# Patient Record
Sex: Female | Born: 1955 | Race: White | Hispanic: No | Marital: Single
Health system: Southern US, Community
[De-identification: ages and names within clinical notes are randomized; demographics above are authoritative.]

---

## 2013-05-15 ENCOUNTER — Emergency Department (HOSPITAL_COMMUNITY)
Admission: EM | Admit: 2013-05-15 | Discharge: 2013-05-16 | Disposition: A | Discharge status: Home / Self Care | Payer: BC Managed Care – PPO | Attending: Emergency Medicine | Admitting: Emergency Medicine

## 2013-05-15 ENCOUNTER — Encounter (HOSPITAL_COMMUNITY): Payer: Self-pay | Admitting: Emergency Medicine

## 2013-05-15 DIAGNOSIS — K8050 Calculus of bile duct without cholangitis or cholecystitis without obstruction: Secondary | ICD-10-CM

## 2013-05-15 DIAGNOSIS — K8020 Calculus of gallbladder without cholecystitis without obstruction: Secondary | ICD-10-CM | POA: Insufficient documentation

## 2013-05-15 NOTE — ED Notes (Signed)
Pt states that she is from out of town and has had abdominal pain, N/V since 7pm tonight. States she has had gas, and bloating before but never anything this bad. Denies any medical hx or surgeries.

## 2013-05-16 ENCOUNTER — Emergency Department (HOSPITAL_COMMUNITY): Payer: BC Managed Care – PPO

## 2013-05-16 LAB — COMPREHENSIVE METABOLIC PANEL
ALK PHOS: 65 U/L (ref 39–117)
ALT: 23 U/L (ref 0–35)
AST: 19 U/L (ref 0–37)
Albumin: 4 g/dL (ref 3.5–5.2)
BILIRUBIN TOTAL: 0.2 mg/dL — AB (ref 0.3–1.2)
BUN: 13 mg/dL (ref 6–23)
CHLORIDE: 99 meq/L (ref 96–112)
CO2: 26 meq/L (ref 19–32)
Calcium: 9.3 mg/dL (ref 8.4–10.5)
Creatinine, Ser: 0.74 mg/dL (ref 0.50–1.10)
Glucose, Bld: 112 mg/dL — ABNORMAL HIGH (ref 70–99)
Potassium: 4 mEq/L (ref 3.7–5.3)
SODIUM: 138 meq/L (ref 137–147)
Total Protein: 7.3 g/dL (ref 6.0–8.3)

## 2013-05-16 LAB — LIPASE, BLOOD: Lipase: 40 U/L (ref 11–59)

## 2013-05-16 LAB — I-STAT TROPONIN, ED: Troponin i, poc: 0 ng/mL (ref 0.00–0.08)

## 2013-05-16 LAB — CBC WITH DIFFERENTIAL/PLATELET
BASOS ABS: 0.1 10*3/uL (ref 0.0–0.1)
Basophils Relative: 0 % (ref 0–1)
Eosinophils Absolute: 0.2 10*3/uL (ref 0.0–0.7)
Eosinophils Relative: 2 % (ref 0–5)
HCT: 40.9 % (ref 36.0–46.0)
Hemoglobin: 13.5 g/dL (ref 12.0–15.0)
LYMPHS PCT: 15 % (ref 12–46)
Lymphs Abs: 1.7 10*3/uL (ref 0.7–4.0)
MCH: 29.4 pg (ref 26.0–34.0)
MCHC: 33 g/dL (ref 30.0–36.0)
MCV: 89.1 fL (ref 78.0–100.0)
Monocytes Absolute: 0.7 10*3/uL (ref 0.1–1.0)
Monocytes Relative: 6 % (ref 3–12)
Neutro Abs: 8.8 10*3/uL — ABNORMAL HIGH (ref 1.7–7.7)
Neutrophils Relative %: 77 % (ref 43–77)
Platelets: 281 10*3/uL (ref 150–400)
RBC: 4.59 MIL/uL (ref 3.87–5.11)
RDW: 13.3 % (ref 11.5–15.5)
WBC: 11.4 10*3/uL — AB (ref 4.0–10.5)

## 2013-05-16 LAB — URINALYSIS, ROUTINE W REFLEX MICROSCOPIC
BILIRUBIN URINE: NEGATIVE
GLUCOSE, UA: NEGATIVE mg/dL
HGB URINE DIPSTICK: NEGATIVE
KETONES UR: NEGATIVE mg/dL
Nitrite: NEGATIVE
PH: 7 (ref 5.0–8.0)
Protein, ur: NEGATIVE mg/dL
SPECIFIC GRAVITY, URINE: 1.022 (ref 1.005–1.030)
Urobilinogen, UA: 0.2 mg/dL (ref 0.0–1.0)

## 2013-05-16 LAB — URINE MICROSCOPIC-ADD ON

## 2013-05-16 MED ORDER — PANTOPRAZOLE SODIUM 40 MG IV SOLR
40.0000 mg | Freq: Once | INTRAVENOUS | Status: AC
  Administered 2013-05-16: 40 mg via INTRAVENOUS
  Filled 2013-05-16: qty 40

## 2013-05-16 MED ORDER — HYDROCODONE-ACETAMINOPHEN 5-325 MG PO TABS: 1.0000 | ORAL_TABLET | ORAL | Status: AC | PRN

## 2013-05-16 MED ORDER — ONDANSETRON HCL 4 MG PO TABS: 4.0000 mg | ORAL_TABLET | Freq: Four times a day (QID) | ORAL | Status: AC | PRN

## 2013-05-16 MED ORDER — HYDROMORPHONE HCL PF 1 MG/ML IJ SOLN
1.0000 mg | Freq: Once | INTRAMUSCULAR | Status: AC
  Administered 2013-05-16: 1 mg via INTRAVENOUS
  Filled 2013-05-16: qty 1

## 2013-05-16 MED ORDER — ONDANSETRON HCL 4 MG/2ML IJ SOLN
4.0000 mg | Freq: Once | INTRAMUSCULAR | Status: AC
  Administered 2013-05-16: 4 mg via INTRAVENOUS
  Filled 2013-05-16: qty 2

## 2013-05-16 MED ORDER — SODIUM CHLORIDE 0.9 % IV SOLN
1000.0000 mL | INTRAVENOUS | Status: DC
  Administered 2013-05-16: 1000 mL via INTRAVENOUS

## 2013-05-16 MED ORDER — SODIUM CHLORIDE 0.9 % IV SOLN
1000.0000 mL | Freq: Once | INTRAVENOUS | Status: AC
  Administered 2013-05-16: 1000 mL via INTRAVENOUS

## 2013-05-16 NOTE — Discharge Instructions (Signed)
Stay on a low fat diet until you have your gallbladder removed.  Biliary Colic  Biliary colic is a steady or irregular pain in the upper abdomen. It is usually under the right side of the rib cage. It happens when gallstones interfere with the normal flow of bile from the gallbladder. Bile is a liquid that helps to digest fats. Bile is made in the liver and stored in the gallbladder. When you eat a meal, bile passes from the gallbladder through the cystic duct and the common bile duct into the small intestine. There, it mixes with partially digested food. If a gallstone blocks either of these ducts, the normal flow of bile is blocked. The muscle cells in the bile duct contract forcefully to try to move the stone. This causes the pain of biliary colic.  SYMPTOMS   A person with biliary colic usually complains of pain in the upper abdomen. This pain can be:  In the center of the upper abdomen just below the breastbone.  In the upper-right part of the abdomen, near the gallbladder and liver.  Spread back toward the right shoulder blade.  Nausea and vomiting.  The pain usually occurs after eating.  Biliary colic is usually triggered by the digestive system's demand for bile. The demand for bile is high after fatty meals. Symptoms can also occur when a person who has been fasting suddenly eats a very large meal. Most episodes of biliary colic pass after 1 to 5 hours. After the most intense pain passes, your abdomen may continue to ache mildly for about 24 hours. DIAGNOSIS  After you describe your symptoms, your caregiver will perform a physical exam. He or she will pay attention to the upper right portion of your belly (abdomen). This is the area of your liver and gallbladder. An ultrasound will help your caregiver look for gallstones. Specialized scans of the gallbladder may also be done. Blood tests may be done, especially if you have fever or if your pain persists. PREVENTION  Biliary colic can  be prevented by controlling the risk factors for gallstones. Some of these risk factors, such as heredity, increasing age, and pregnancy are a normal part of life. Obesity and a high-fat diet are risk factors you can change through a healthy lifestyle. Women going through menopause who take hormone replacement therapy (estrogen) are also more likely to develop biliary colic. TREATMENT   Pain medication may be prescribed.  You may be encouraged to eat a fat-free diet.  If the first episode of biliary colic is severe, or episodes of colic keep retuning, surgery to remove the gallbladder (cholecystectomy) is usually recommended. This procedure can be done through small incisions using an instrument called a laparoscope. The procedure often requires a brief stay in the hospital. Some people can leave the hospital the same day. It is the most widely used treatment in people troubled by painful gallstones. It is effective and safe, with no complications in more than 90% of cases.  If surgery cannot be done, medication that dissolves gallstones may be used. This medication is expensive and can take months or years to work. Only small stones will dissolve.  Rarely, medication to dissolve gallstones is combined with a procedure called shock-wave lithotripsy. This procedure uses carefully aimed shock waves to break up gallstones. In many people treated with this procedure, gallstones form again within a few years. PROGNOSIS  If gallstones block your cystic duct or common bile duct, you are at risk for repeated episodes  of biliary colic. There is also a 25% chance that you will develop a gallbladder infection(acute cholecystitis), or some other complication of gallstones within 10 to 20 years. If you have surgery, schedule it at a time that is convenient for you and at a time when you are not sick. HOME CARE INSTRUCTIONS   Drink plenty of clear fluids.  Avoid fatty, greasy or fried foods, or any foods that  make your pain worse.  Take medications as directed. SEEK MEDICAL CARE IF:   You develop a fever over 100.5 F (38.1 C).  Your pain gets worse over time.  You develop nausea that prevents you from eating and drinking.  You develop vomiting. SEEK IMMEDIATE MEDICAL CARE IF:   You have continuous or severe belly (abdominal) pain which is not relieved with medications.  You develop nausea and vomiting which is not relieved with medications.  You have symptoms of biliary colic and you suddenly develop a fever and shaking chills. This may signal cholecystitis. Call your caregiver immediately.  You develop a yellow color to your skin or the white part of your eyes (jaundice). Document Released: 07/19/2005 Document Revised: 05/10/2011 Document Reviewed: 09/28/2007 [facility] Patient Information 2014 [place], [place].  Acetaminophen; Hydrocodone tablets or capsules What is this medicine? ACETAMINOPHEN; HYDROCODONE (a set a MEE noe fen; hye droe KOE done) is a pain reliever. It is used to treat mild to moderate pain. This medicine may be used for other purposes; ask your health care provider or pharmacist if you have questions. COMMON BRAND NAME(S): Anexsia, Bancap HC , Ceta-Plus, Co-Gesic, Comfortpak , Dolagesic, [employer], [address] , [address] , Hydrogesic, Lorcet HD, Lorcet Plus, Lorcet, [place], Margesic H, Maxidone, [place], Polygesic, [place], [place], Vicodin ES, Vicodin HP, Vicodin, [place] What should I tell my health care provider before I take this medicine? They need to know if you have any of these conditions: -brain tumor -Crohn's disease, inflammatory bowel disease, or ulcerative colitis -drug abuse or addiction -head injury -heart or circulation problems -if you often drink alcohol -kidney disease or problems going to the bathroom -liver disease -lung disease, asthma, or breathing problems -an unusual or allergic reaction to acetaminophen, hydrocodone, other opioid  analgesics, other medicines, foods, dyes, or preservatives -pregnant or trying to get pregnant -breast-feeding How should I use this medicine? Take this medicine by mouth. Swallow it with a full glass of water. Follow the directions on the prescription label. If the medicine upsets your stomach, take the medicine with food or milk. Do not take more than you are told to take. Talk to your pediatrician regarding the use of this medicine in children. This medicine is not approved for use in children. Overdosage: If you think you have taken too much of this medicine contact a poison control center or emergency room at once. NOTE: This medicine is only for you. Do not share this medicine with others. What if I miss a dose? If you miss a dose, take it as soon as you can. If it is almost time for your next dose, take only that dose. Do not take double or extra doses. What may interact with this medicine? -alcohol -antihistamines -isoniazid -medicines for depression, anxiety, or psychotic disturbances -medicines for sleep -muscle relaxants -naltrexone -narcotic medicines (opiates) for pain -phenobarbital -ritonavir -tramadol This list may not describe all possible interactions. Give your health care provider a list of all the medicines, herbs, non-prescription drugs, or dietary supplements you use. Also tell them if you smoke, drink alcohol,  or use illegal drugs. Some items may interact with your medicine. What should I watch for while using this medicine? Tell your doctor or health care professional if your pain does not go away, if it gets worse, or if you have new or a different type of pain. You may develop tolerance to the medicine. Tolerance means that you will need a higher dose of the medicine for pain relief. Tolerance is normal and is expected if you take the medicine for a long time. Do not suddenly stop taking your medicine because you may develop a severe reaction. Your body becomes  used to the medicine. This does NOT mean you are addicted. Addiction is a behavior related to getting and using a drug for a non-medical reason. If you have pain, you have a medical reason to take pain medicine. Your doctor will tell you how much medicine to take. If your doctor wants you to stop the medicine, the dose will be slowly lowered over time to avoid any side effects. You may get drowsy or dizzy when you first start taking the medicine or change doses. Do not drive, use machinery, or do anything that may be dangerous until you know how the medicine affects you. Stand or sit up slowly. There are different types of narcotic medicines (opiates) for pain. If you take more than one type at the same time, you may have more side effects. Give your health care provider a list of all medicines you use. Your doctor will tell you how much medicine to take. Do not take more medicine than directed. Call emergency for help if you have problems breathing. The medicine will cause constipation. Try to have a bowel movement at least every 2 to 3 days. If you do not have a bowel movement for 3 days, call your doctor or health care professional. Too much acetaminophen can be very dangerous. Do not take Tylenol (acetaminophen) or medicines that contain acetaminophen with this medicine. Many non-prescription medicines contain acetaminophen. Always read the labels carefully. What side effects may I notice from receiving this medicine? Side effects that you should report to your doctor or health care professional as soon as possible: -allergic reactions like skin rash, itching or hives, swelling of the face, lips, or tongue -breathing problems -confusion -feeling faint or lightheaded, falls -stomach pain -yellowing of the eyes or skin Side effects that usually do not require medical attention (report to your doctor or health care professional if they continue or are bothersome): -nausea, vomiting -stomach  upset This list may not describe all possible side effects. Call your doctor for medical advice about side effects. You may report side effects to FDA at 1-800-FDA-1088. Where should I keep my medicine? Keep out of the reach of children. This medicine can be abused. Keep your medicine in a safe place to protect it from theft. Do not share this medicine with anyone. Selling or giving away this medicine is dangerous and against the law. Store at room temperature between 15 and 30 degrees C (59 and 86 degrees F). Protect from light. Keep container tightly closed.  Throw away any unused medicine after the expiration date. Discard unused medicine and used packaging carefully. Pets and children can be harmed if they find used or lost packages. NOTE: This sheet is a summary. It may not cover all possible information. If you have questions about this medicine, talk to your doctor, pharmacist, or health care provider.  2014, Elsevier/Gold Standard. (2012-10-09 13:15:56)  Ondansetron tablets What  is this medicine? ONDANSETRON (on DAN se tron) is used to treat nausea and vomiting caused by chemotherapy. It is also used to prevent or treat nausea and vomiting after surgery. This medicine may be used for other purposes; ask your health care provider or pharmacist if you have questions. COMMON BRAND NAME(S): Zofran What should I tell my health care provider before I take this medicine? They need to know if you have any of these conditions: -heart disease -history of irregular heartbeat -liver disease -low levels of magnesium or potassium in the blood -an unusual or allergic reaction to ondansetron, granisetron, other medicines, foods, dyes, or preservatives -pregnant or trying to get pregnant -breast-feeding How should I use this medicine? Take this medicine by mouth with a glass of water. Follow the directions on your prescription label. Take your doses at regular intervals. Do not take your medicine  more often than directed. Talk to your pediatrician regarding the use of this medicine in children. Special care may be needed. Overdosage: If you think you have taken too much of this medicine contact a poison control center or emergency room at once. NOTE: This medicine is only for you. Do not share this medicine with others. What if I miss a dose? If you miss a dose, take it as soon as you can. If it is almost time for your next dose, take only that dose. Do not take double or extra doses. What may interact with this medicine? Do not take this medicine with any of the following medications: -apomorphine -certain medicines for fungal infections like fluconazole, itraconazole, ketoconazole, posaconazole, voriconazole -cisapride -dofetilide -dronedarone -pimozide -thioridazine -ziprasidone  This medicine may also interact with the following medications: -carbamazepine -certain medicines for depression, anxiety, or psychotic disturbances -fentanyl -linezolid -MAOIs like Carbex, Eldepryl, Marplan, Nardil, and Parnate -methylene blue (injected into a vein) -other medicines that prolong the QT interval (cause an abnormal heart rhythm) -phenytoin -rifampicin -tramadol This list may not describe all possible interactions. Give your health care provider a list of all the medicines, herbs, non-prescription drugs, or dietary supplements you use. Also tell them if you smoke, drink alcohol, or use illegal drugs. Some items may interact with your medicine. What should I watch for while using this medicine? Check with your doctor or health care professional right away if you have any sign of an allergic reaction. What side effects may I notice from receiving this medicine? Side effects that you should report to your doctor or health care professional as soon as possible: -allergic reactions like skin rash, itching or hives, swelling of the face, lips or tongue -breathing  problems -confusion -dizziness -fast or irregular heartbeat -feeling faint or lightheaded, falls -fever and chills -loss of balance or coordination -seizures -sweating -swelling of the hands or feet -tightness in the chest -tremors -unusually weak or tired Side effects that usually do not require medical attention (report to your doctor or health care professional if they continue or are bothersome): -constipation or diarrhea -headache This list may not describe all possible side effects. Call your doctor for medical advice about side effects. You may report side effects to FDA at 1-800-FDA-1088. Where should I keep my medicine? Keep out of the reach of children. Store between 2 and 30 degrees C (36 and 86 degrees F). Throw away any unused medicine after the expiration date. NOTE: This sheet is a summary. It may not cover all possible information. If you have questions about this medicine, talk to your doctor, pharmacist, or  health care provider.  2014, Elsevier/Gold Standard. (2012-11-22 16:27:45)

## 2013-05-16 NOTE — ED Provider Notes (Signed)
CSN: [id number]     Arrival date & time 05/15/13  2330 History   First MD Initiated Contact with Patient 05/16/13 0139     Chief Complaint  Patient presents with  . Abdominal Pain  . Emesis     (Consider location/radiation/quality/duration/timing/severity/associated sxs/prior Treatment) Patient is a 58 y.o. female presenting with abdominal pain and vomiting. The history is provided by the patient.  Abdominal Pain Associated symptoms: vomiting   Emesis Associated symptoms: abdominal pain   She is about 7 PM severe epigastric pain with radiation to the back into the lower chest. Pain started out as a pressure and like a lot of gas. It has gotten much more severe. She is vomited several times with no relief of pain. Since arrival in the ED, she got a dose of ondansetron and states she no longer feels nauseated he will have paroxysms of vomiting in spite of no underlying nausea. She's had several other episodes like this. The episode tonight is the most severe. Tonight's episode started after eating hamburger and french fries. Other episodes of also occurred following a somewhat fatty food. Today, she rates her pain at 10/10. It feels somewhat better when she sits up and worse if she lays flat. She's not had any abdominal surgery in the past. She's not on any medications.  History reviewed. No pertinent past medical history. No past surgical history on file. No family history on file. History  Substance Use Topics  . Smoking status: Not on file  . Smokeless tobacco: Not on file  . Alcohol Use: Not on file   OB History   Grav Para Term Preterm Abortions TAB SAB Ect Mult Living                 Review of Systems  Gastrointestinal: Positive for vomiting and abdominal pain.  All other systems reviewed and are negative.      Allergies  Review of patient's allergies indicates no known allergies.  Home Medications  No current outpatient prescriptions on file. BP 129/85  Pulse 74   Temp(Src) 98.2 F (36.8 C) (Oral)  Resp 18  SpO2 97% Physical Exam  Nursing note and vitals reviewed.  58 year old female, who appears to be in pain, but is in no acute distress. Vital signs are normal. Oxygen saturation is 97%, which is normal. Head is normocephalic and atraumatic. PERRLA, EOMI. Oropharynx is clear. Neck is nontender and supple without adenopathy or JVD. Back is nontender and there is no CVA tenderness. Lungs are clear without rales, wheezes, or rhonchi. Chest is nontender. Heart has regular rate and rhythm without murmur. Abdomen is soft, flat, with mild tenderness in the epigastrium and right upper quadrant. There is a plus/minus Murphy sign. There are no masses or hepatosplenomegaly and peristalsis is hypoactive. Extremities have no cyanosis or edema, full range of motion is present. Skin is warm and dry without rash. Neurologic: Mental status is normal, cranial nerves are intact, there are no motor or sensory deficits.  ED Course  Procedures (including critical care time) Labs Review Results for orders placed during the hospital encounter of 05/15/13  CBC WITH DIFFERENTIAL      Result Value Ref Range   WBC 11.4 (*) 4.0 - 10.5 K/uL   RBC 4.59  3.87 - 5.11 MIL/uL   Hemoglobin 13.5  12.0 - 15.0 g/dL   HCT 40.9  81.1 - 91.4 %   MCV 89.1  78.0 - 100.0 fL   MCH 29.4  26.0 -  34.0 pg   MCHC 33.0  30.0 - 36.0 g/dL   RDW 16.113.3  09.611.5 - 04.515.5 %   Platelets 281  150 - 400 K/uL   Neutrophils Relative % 77  43 - 77 %   Neutro Abs 8.8 (*) 1.7 - 7.7 K/uL   Lymphocytes Relative 15  12 - 46 %   Lymphs Abs 1.7  0.7 - 4.0 K/uL   Monocytes Relative 6  3 - 12 %   Monocytes Absolute 0.7  0.1 - 1.0 K/uL   Eosinophils Relative 2  0 - 5 %   Eosinophils Absolute 0.2  0.0 - 0.7 K/uL   Basophils Relative 0  0 - 1 %   Basophils Absolute 0.1  0.0 - 0.1 K/uL  COMPREHENSIVE METABOLIC PANEL      Result Value Ref Range   Sodium 138  137 - 147 mEq/L   Potassium 4.0  3.7 - 5.3 mEq/L    Chloride 99  96 - 112 mEq/L   CO2 26  19 - 32 mEq/L   Glucose, Bld 112 (*) 70 - 99 mg/dL   BUN 13  6 - 23 mg/dL   Creatinine, Ser 4.090.74  0.50 - 1.10 mg/dL   Calcium 9.3  8.4 - 81.110.5 mg/dL   Total Protein 7.3  6.0 - 8.3 g/dL   Albumin 4.0  3.5 - 5.2 g/dL   AST 19  0 - 37 U/L   ALT 23  0 - 35 U/L   Alkaline Phosphatase 65  39 - 117 U/L   Total Bilirubin 0.2 (*) 0.3 - 1.2 mg/dL   GFR calc non Af Amer >90  >90 mL/min   GFR calc Af Amer >90  >90 mL/min  LIPASE, BLOOD      Result Value Ref Range   Lipase 40  11 - 59 U/L  URINALYSIS, ROUTINE W REFLEX MICROSCOPIC      Result Value Ref Range   Color, Urine YELLOW  YELLOW   APPearance CLOUDY (*) CLEAR   Specific Gravity, Urine 1.022  1.005 - 1.030   pH 7.0  5.0 - 8.0   Glucose, UA NEGATIVE  NEGATIVE mg/dL   Hgb urine dipstick NEGATIVE  NEGATIVE   Bilirubin Urine NEGATIVE  NEGATIVE   Ketones, ur NEGATIVE  NEGATIVE mg/dL   Protein, ur NEGATIVE  NEGATIVE mg/dL   Urobilinogen, UA 0.2  0.0 - 1.0 mg/dL   Nitrite NEGATIVE  NEGATIVE   Leukocytes, UA SMALL (*) NEGATIVE  URINE MICROSCOPIC-ADD ON      Result Value Ref Range   WBC, UA 3-6  <3 WBC/hpf   Bacteria, UA RARE  RARE  I-STAT TROPOININ, ED      Result Value Ref Range   Troponin i, poc 0.00  0.00 - 0.08 ng/mL   Comment 3            Imaging Review [place] Abdomen Complete  05/16/2013   CLINICAL DATA:  Abdominal pain and vomiting.  EXAM: ULTRASOUND ABDOMEN COMPLETE  COMPARISON:  None.  FINDINGS: Gallbladder:  Multiple small gallstones are identified measuring up to 0.7 cm and there is gallbladder sludge. A 0.6 cm stone appears to be lodged in the gallbladder neck. There is no gallbladder wall thickening or pericholecystic fluid. Sonographer reports negative Murphy's sign.  Common bile duct:  Diameter: 0.6 cm  Liver:  No focal lesion identified. Within normal limits in parenchymal echogenicity.  IVC:  No abnormality visualized.  Pancreas:  Visualized portion unremarkable.  Spleen:  Size and  appearance within normal limits.  Right Kidney:  Length: 12.0 cm. Echogenicity within normal limits. No mass or hydronephrosis visualized.  Left Kidney:  Length: 10.5 cm. Echogenicity within normal limits. No mass or hydronephrosis visualized.  Abdominal aorta:  No aneurysm visualized.  Other findings:  None.  IMPRESSION: Multiple small gallstones are present and there is gallbladder sludge. A 0.6 cm stone appears lodged in the gallbladder neck. There are no ultrasound signs of acute cholecystitis.   Electronically Signed   By: Drusilla Kanner M.D.   On: 05/16/2013 03:19    ECG shows normal sinus rhythm with a rate of 68, no ectopy. Normal axis. Normal P wave. Normal QRS. Normal intervals. Normal ST and T waves. Impression: normal ECG. No prior ECGs available for comparison.  MDM   Final diagnoses:  Biliary colic    Abdominal pain and vomiting in a pattern worrisome for possible biliary colic. Laboratory workup was significant for mild elevation of WBC but normal liver enzymes and lipase. She will be sent for ultrasound. She will also be given hydromorphone for pain and additional ondansetron. Abdominal ultrasound has been ordered. She has no prior records in our medical system.  She got good pain relief with above noted treatment. Ultrasound shows multiple gallstones without evidence of acute cholecystitis. Patient is discharged with prescription for hydrocodone-acetaminophen and also prescription for ondansetron. She is from out of town and she is advised to contact her PCP to arrange surgical referral as soon as possible and to stay on a low-fat diet until she is able to have her gallbladder removed  Linda Booze, MD 05/16/13 [phone]

## 2013-05-16 NOTE — ED Notes (Signed)
Patient transported to Ultrasound 

## 2014-11-14 IMAGING — US US ABDOMEN COMPLETE
1 series · 14 of 25 positions shown · non-contrast
Comparison: None.

CLINICAL DATA: Abdominal pain and vomiting.

EXAM:
ULTRASOUND ABDOMEN COMPLETE

[Series 1: us abdomen complete · 0.18mm/px · 14 of 83 slices shown]
[im 1/83]
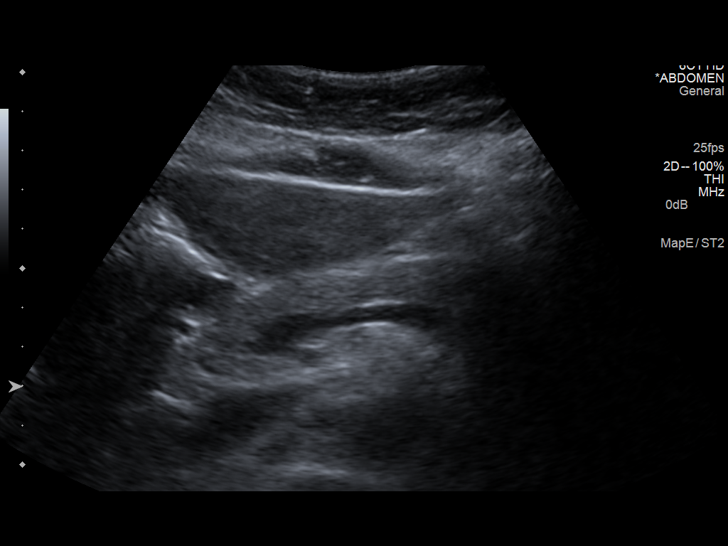
[im 7/83]
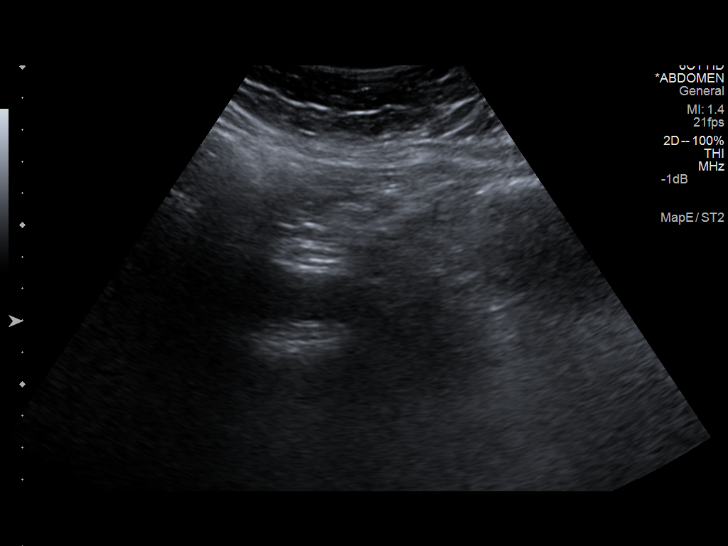
[im 14/83]
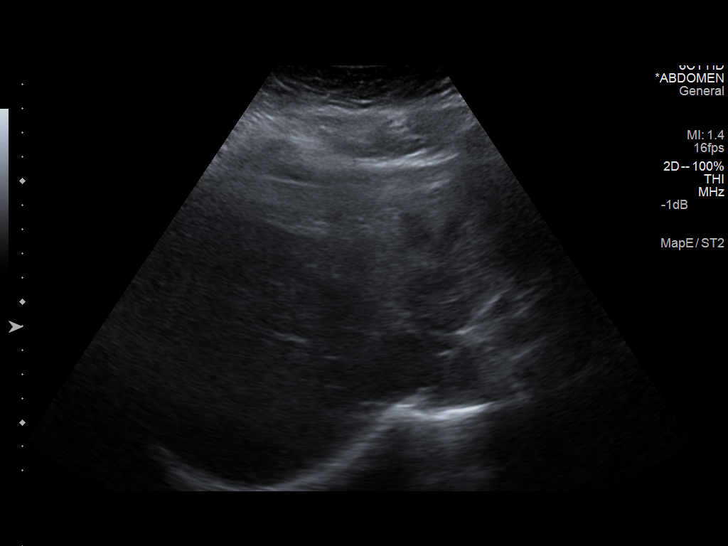
[im 21/83]
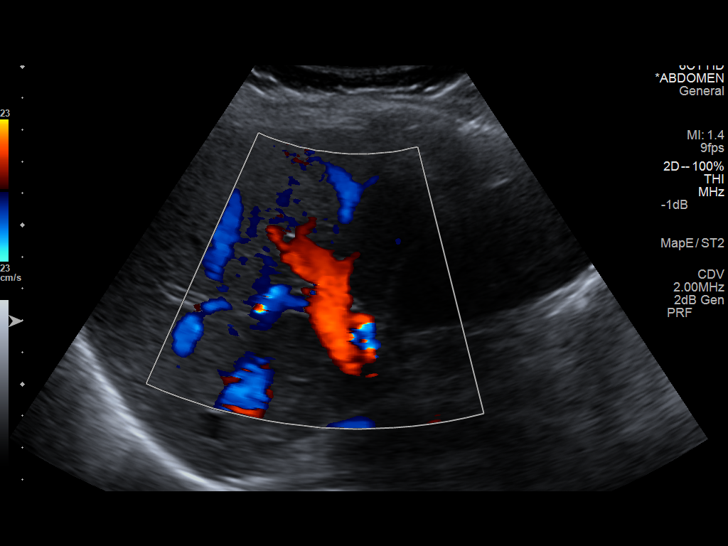
[im 28/83]
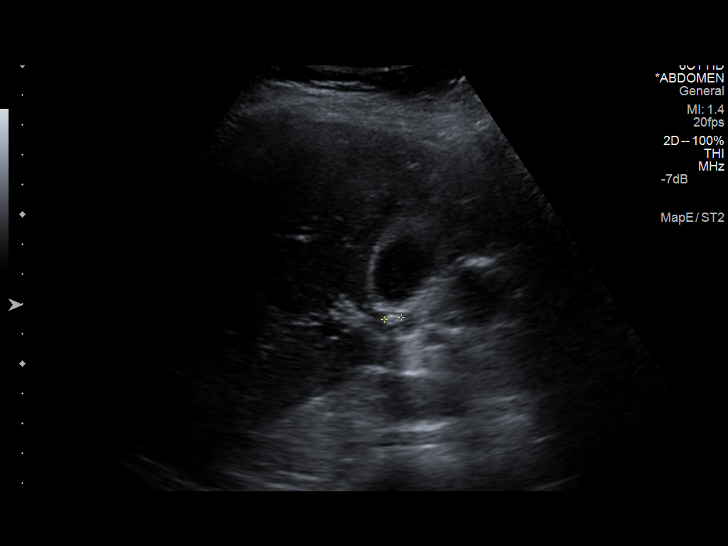
[im 31/83]
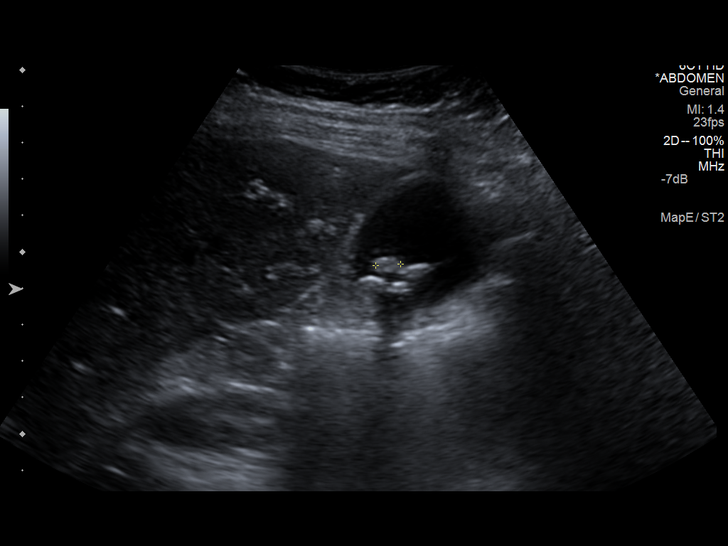
[im 38/83]
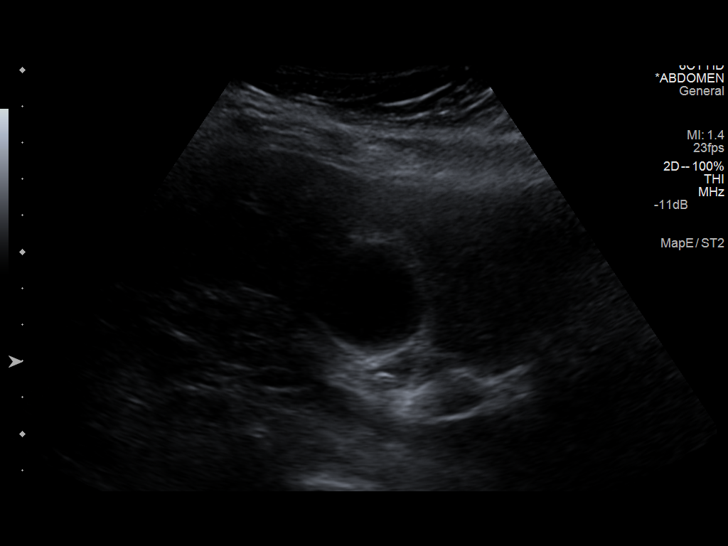
[im 45/83]
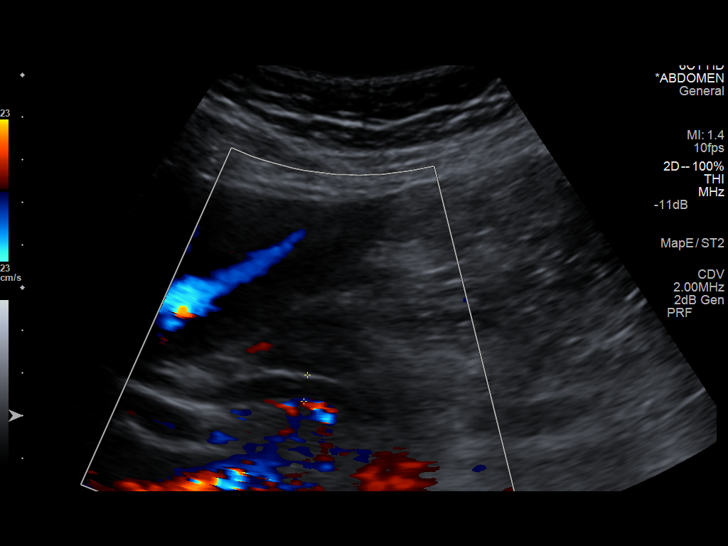
[im 52/83]
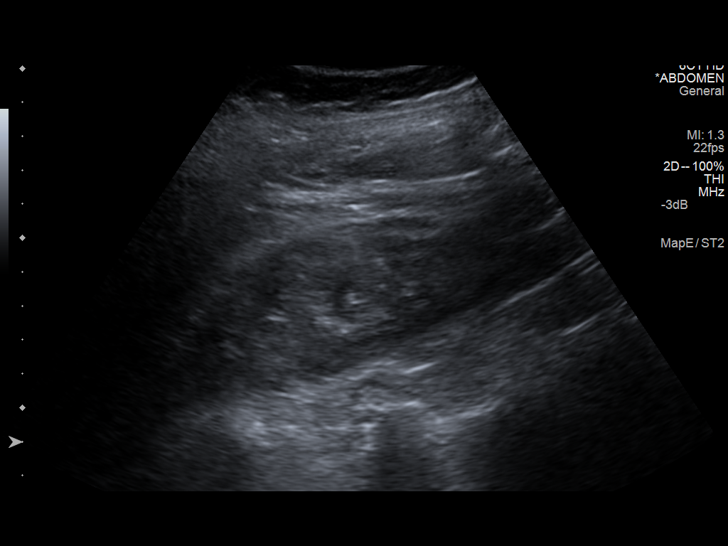
[im 55/83]
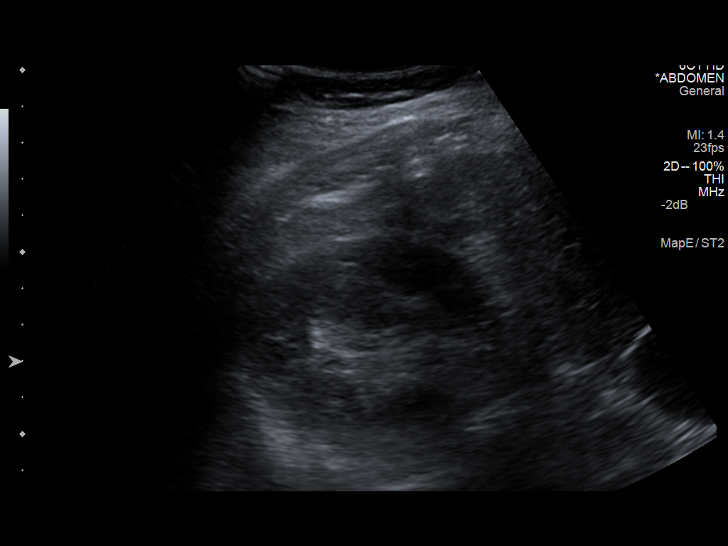
[im 62/83]
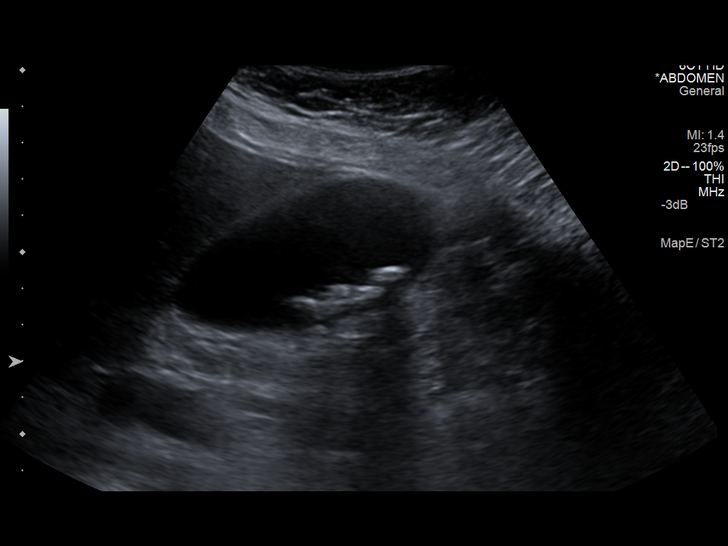
[im 69/83]
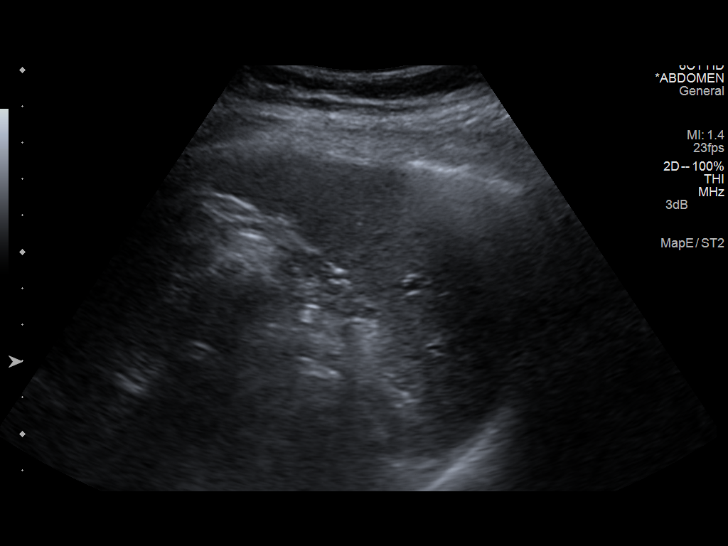
[im 76/83]
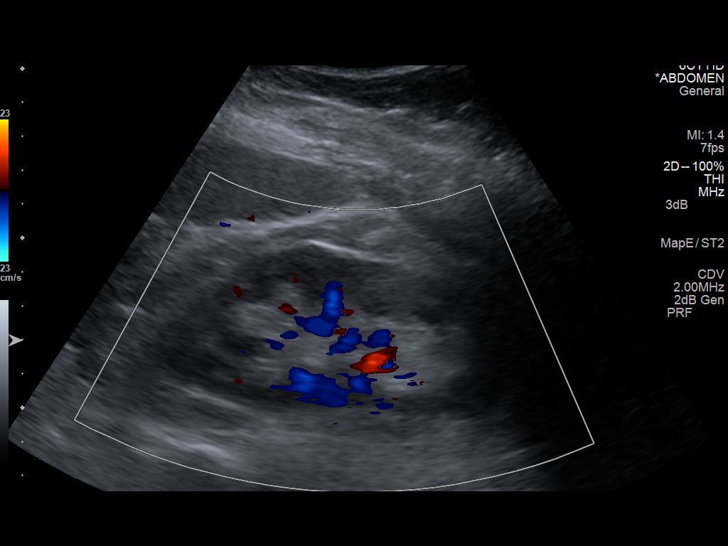
[im 83/83]
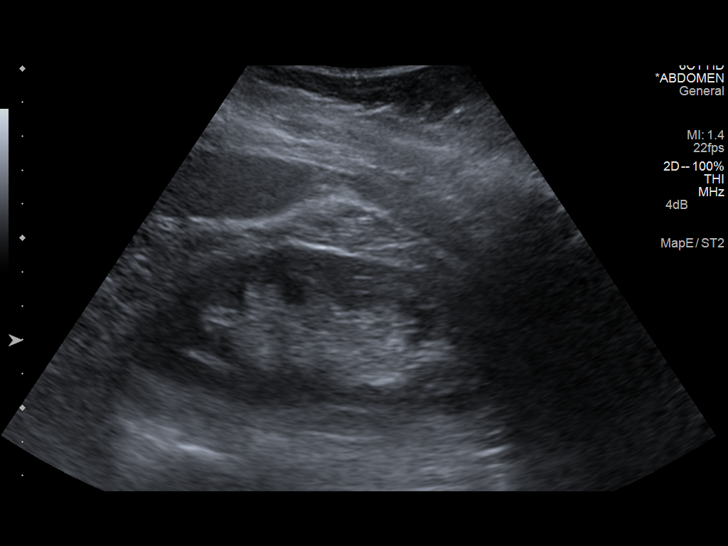

[14 of 25 positions shown; findings below may reference images not displayed]

FINDINGS: Gallbladder:

Multiple small gallstones are identified measuring up to 0.7 cm and
there is gallbladder sludge. A 0.6 cm stone appears to be lodged in
the gallbladder neck. There is no gallbladder wall thickening or
pericholecystic fluid. Sonographer reports negative Murphy's sign.

Common bile duct:

Diameter: 0.6 cm

Liver:

No focal lesion identified. Within normal limits in parenchymal
echogenicity.

IVC:

No abnormality visualized.

Pancreas:

Visualized portion unremarkable.

Spleen:

Size and appearance within normal limits.

Right Kidney:

Length: 12.0 cm. Echogenicity within normal limits. No mass or
hydronephrosis visualized.

Left Kidney:

Length: 10.5 cm. Echogenicity within normal limits. No mass or
hydronephrosis visualized.

Abdominal aorta:

No aneurysm visualized.

Other findings:

None.
IMPRESSION: Multiple small gallstones are present and there is gallbladder
sludge. A 0.6 cm stone appears lodged in the gallbladder neck. There
are no ultrasound signs of acute cholecystitis.
